# Patient Record
Sex: Male | Born: 2000 | Hispanic: No | Marital: Single
Health system: Southern US, Community
[De-identification: ages and names within clinical notes are randomized; demographics above are authoritative.]

## PROBLEM LIST (undated history)

## (undated) DIAGNOSIS — J45909 Unspecified asthma, uncomplicated: Secondary | ICD-10-CM

---
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
